# Patient Record
Sex: Male | Born: 1960 | Race: Black or African American | Hispanic: No | Marital: Married | Smoking: Current every day smoker
Health system: Southern US, Community
[De-identification: ages and names within clinical notes are randomized; demographics above are authoritative.]

## PROBLEM LIST (undated history)

## (undated) HISTORY — PX: MANDIBLE SURGERY: SHX707

---

## 2000-10-12 ENCOUNTER — Emergency Department (HOSPITAL_COMMUNITY): Admission: EM | Admit: 2000-10-12 | Discharge: 2000-10-12 | Payer: Self-pay | Admitting: Internal Medicine

## 2002-07-25 ENCOUNTER — Emergency Department (HOSPITAL_COMMUNITY): Admission: EM | Admit: 2002-07-25 | Discharge: 2002-07-25 | Payer: Self-pay | Admitting: Emergency Medicine

## 2003-05-25 ENCOUNTER — Emergency Department (HOSPITAL_COMMUNITY): Admission: EM | Admit: 2003-05-25 | Discharge: 2003-05-26 | Payer: Self-pay | Admitting: Emergency Medicine

## 2003-07-09 ENCOUNTER — Emergency Department (HOSPITAL_COMMUNITY): Admission: EM | Admit: 2003-07-09 | Discharge: 2003-07-09 | Payer: Self-pay | Admitting: Emergency Medicine

## 2004-05-27 ENCOUNTER — Ambulatory Visit: Payer: Self-pay | Admitting: Internal Medicine

## 2004-10-30 ENCOUNTER — Ambulatory Visit: Payer: Self-pay | Admitting: Internal Medicine

## 2004-10-31 ENCOUNTER — Ambulatory Visit (HOSPITAL_COMMUNITY): Admission: RE | Admit: 2004-10-31 | Discharge: 2004-10-31 | Payer: Self-pay | Admitting: Internal Medicine

## 2004-11-08 ENCOUNTER — Ambulatory Visit: Payer: Self-pay | Admitting: Internal Medicine

## 2004-11-12 ENCOUNTER — Ambulatory Visit: Payer: Self-pay | Admitting: *Deleted

## 2005-01-05 ENCOUNTER — Emergency Department (HOSPITAL_COMMUNITY): Admission: EM | Admit: 2005-01-05 | Discharge: 2005-01-05 | Payer: Self-pay | Admitting: Emergency Medicine

## 2005-11-12 ENCOUNTER — Emergency Department (HOSPITAL_COMMUNITY): Admission: EM | Admit: 2005-11-12 | Discharge: 2005-11-12 | Payer: Self-pay | Admitting: Emergency Medicine

## 2007-05-21 ENCOUNTER — Emergency Department (HOSPITAL_COMMUNITY): Admission: EM | Admit: 2007-05-21 | Discharge: 2007-05-21 | Payer: Self-pay | Admitting: Emergency Medicine

## 2009-01-27 ENCOUNTER — Emergency Department (HOSPITAL_COMMUNITY): Admission: EM | Admit: 2009-01-27 | Discharge: 2009-01-27 | Payer: Self-pay | Admitting: Emergency Medicine

## 2010-02-18 ENCOUNTER — Emergency Department (HOSPITAL_COMMUNITY)
Admission: EM | Admit: 2010-02-18 | Discharge: 2010-02-18 | Payer: Self-pay | Source: Home / Self Care | Admitting: *Deleted

## 2010-06-05 LAB — COMPREHENSIVE METABOLIC PANEL
AST: 33 U/L (ref 0–37)
Albumin: 5 g/dL (ref 3.5–5.2)
Alkaline Phosphatase: 81 U/L (ref 39–117)
BUN: 14 mg/dL (ref 6–23)
CO2: 24 mEq/L (ref 19–32)
GFR calc Af Amer: >60 mL/min (ref >60)
GFR calc non Af Amer: >60 mL/min (ref >60)
Glucose, Bld: 109 mg/dL — ABNORMAL HIGH (ref 70–99)
Potassium: 4.1 mEq/L (ref 3.5–5.1)
Total Protein: 8.9 g/dL — ABNORMAL HIGH (ref 6.0–8.3)

## 2010-06-05 LAB — DIFFERENTIAL
Basophils Absolute: 0 10*3/uL (ref 0.0–0.1)
Basophils Relative: 0 % (ref 0–1)
Eosinophils Absolute: 0.1 K/uL (ref 0.0–0.7)
Eosinophils Relative: 1 % (ref 0–5)
Lymphocytes Relative: 7 % — ABNORMAL LOW (ref 12–46)
Lymphs Abs: 0.9 10*3/uL (ref 0.7–4.0)
Monocytes Absolute: 0.7 10*3/uL (ref 0.1–1.0)
Monocytes Relative: 6 % (ref 3–12)
Neutro Abs: 10.4 K/uL — ABNORMAL HIGH (ref 1.7–7.7)
Neutrophils Relative %: 86 % — ABNORMAL HIGH (ref 43–77)

## 2010-06-05 LAB — CBC
HCT: 54.5 % — ABNORMAL HIGH (ref 39.0–52.0)
Hemoglobin: 18.1 g/dL — ABNORMAL HIGH (ref 13.0–17.0)
MCHC: 33.1 g/dL (ref 30.0–36.0)
MCV: 93 fL (ref 78.0–100.0)
Platelets: 313 10*3/uL (ref 150–400)
RBC: 5.86 MIL/uL — ABNORMAL HIGH (ref 4.22–5.81)
RDW: 13.6 % (ref 11.5–15.5)
WBC: 12.1 10*3/uL — ABNORMAL HIGH (ref 4.0–10.5)

## 2010-06-05 LAB — CLOSTRIDIUM DIFFICILE EIA: C difficile Toxins A+B, EIA: NEGATIVE

## 2010-06-05 LAB — COMPREHENSIVE METABOLIC PANEL WITH GFR
ALT: 34 U/L (ref 0–53)
Calcium: 10.1 mg/dL (ref 8.4–10.5)
Chloride: 103 meq/L (ref 96–112)
Creatinine, Ser: 1.28 mg/dL (ref 0.4–1.5)
Sodium: 137 meq/L (ref 135–145)
Total Bilirubin: 0.5 mg/dL (ref 0.3–1.2)

## 2010-06-05 LAB — STOOL CULTURE

## 2010-06-05 LAB — HEMOCCULT GUIAC POC 1CARD (OFFICE): Fecal Occult Bld: POSITIVE

## 2011-03-06 IMAGING — CT CT ABDOMEN W/ CM
2 of 5 series · 17 of 46 positions shown, 19 images · IV contrast (APPLIED)
Comparison: None.

CT ABDOMEN

CLINICAL DATA: Abdominal pain nausea, vomiting, elevated white
count

CT ABDOMEN AND PELVIS WITH CONTRAST
TECHNIQUE: Multidetector CT imaging of the abdomen and pelvis was
performed using the standard protocol following bolus
administration of intravenous contrast.
Contrast: 120 ml Ymnipaque-OCC

[Series 2: abd/pelv with 5.0 b31f st · axial · 0.76mm/px · z∈[-510,-86]mm · 14 of 96 slices shown, 16 images]
[im 6/96  soft-tissue]
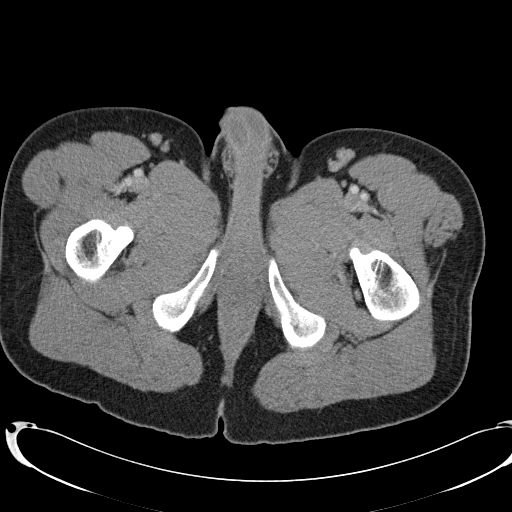
[im 6/96  bone]
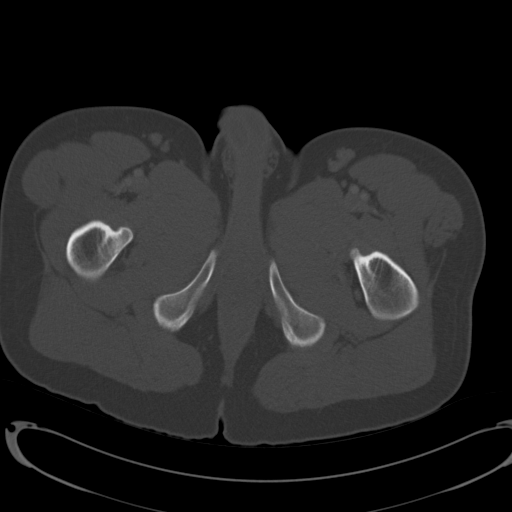
[im 11/96  soft-tissue]
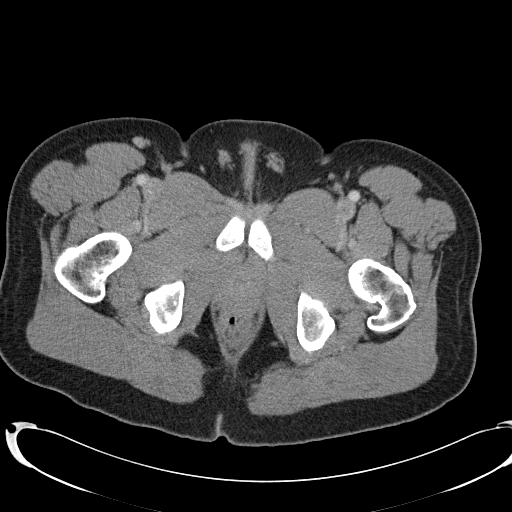
[im 21/96  soft-tissue]
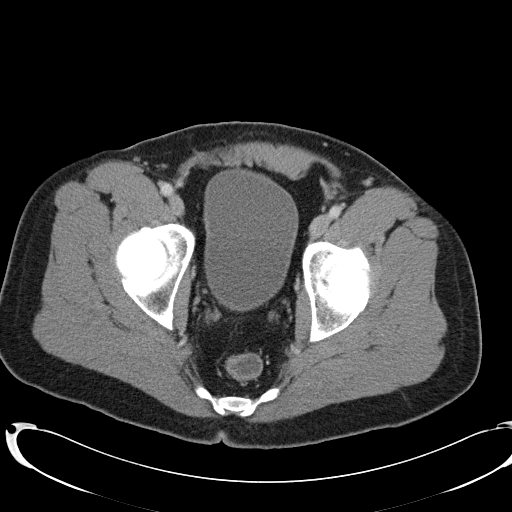
[im 26/96  soft-tissue]
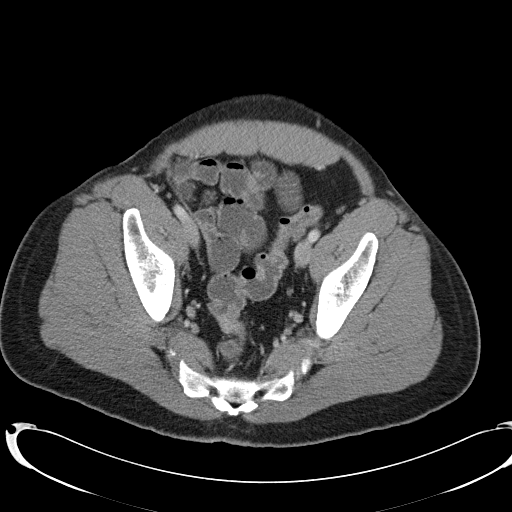
[im 31/96  soft-tissue]
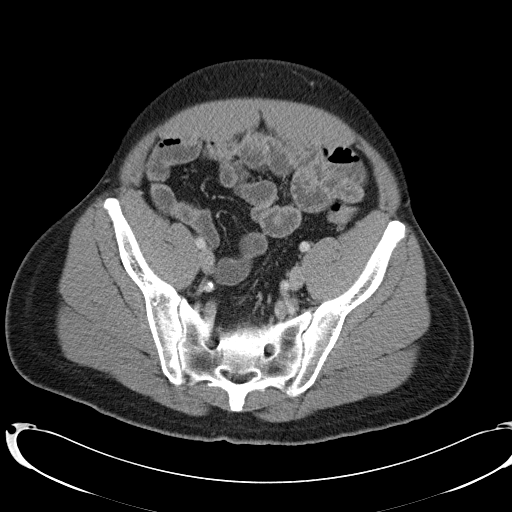
[im 41/96  soft-tissue]
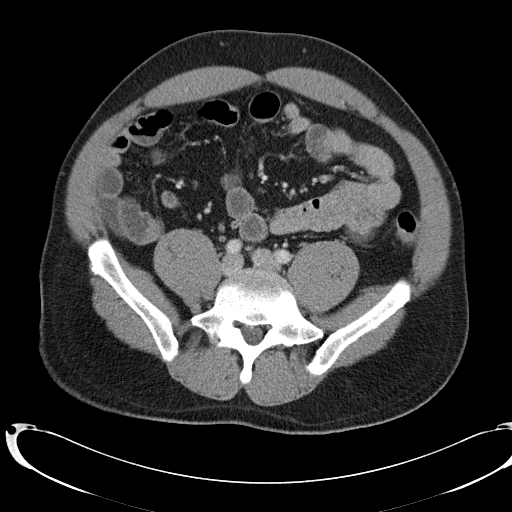
[im 46/96  soft-tissue]
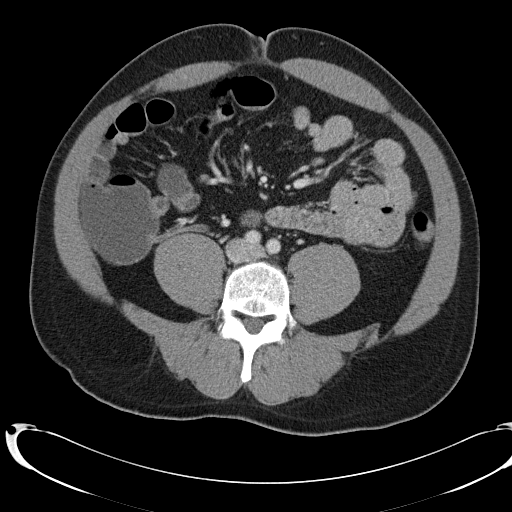
[im 51/96  soft-tissue]
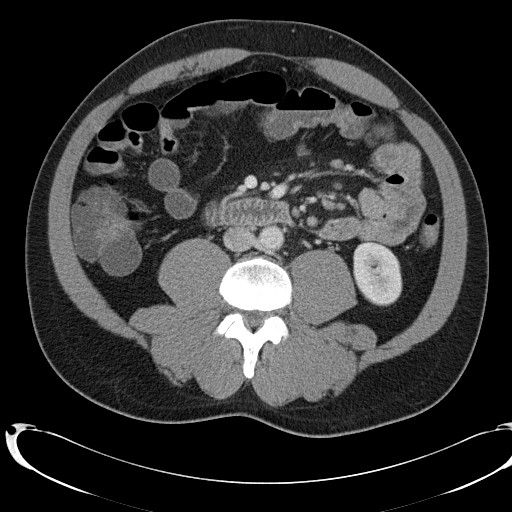
[im 56/96  soft-tissue]
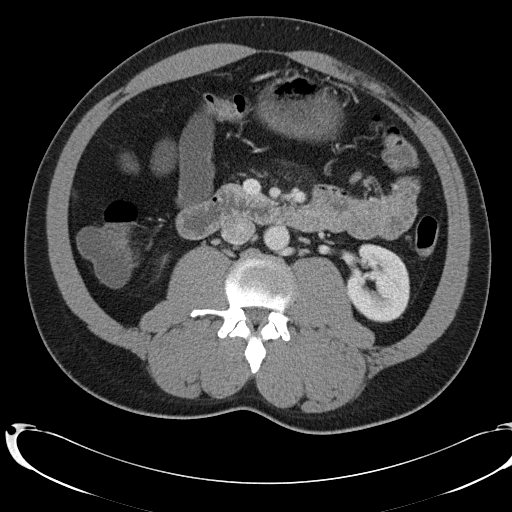
[im 56/96  bone]
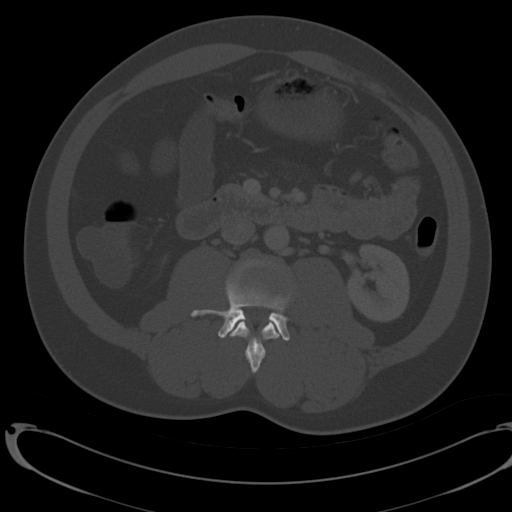
[im 66/96  soft-tissue]
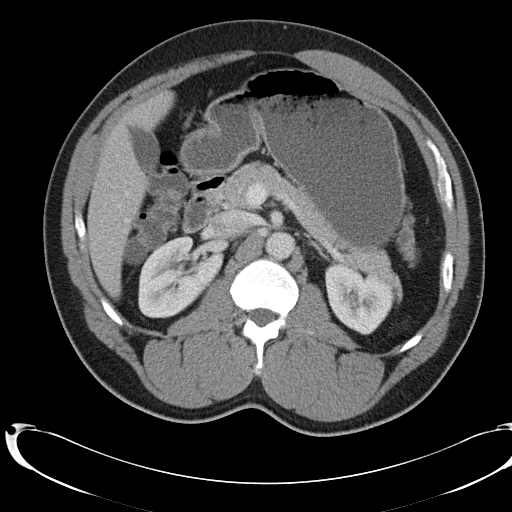
[im 71/96  soft-tissue]
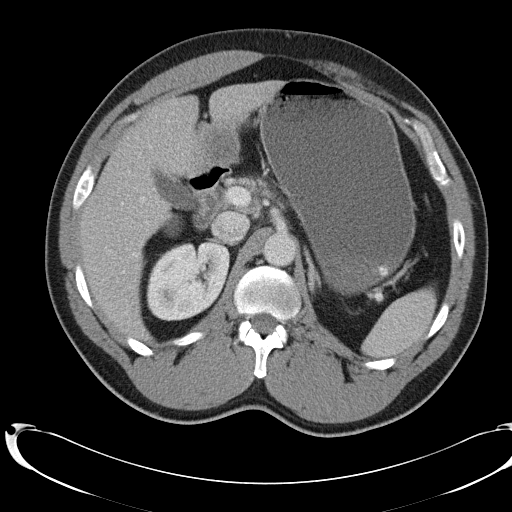
[im 76/96  soft-tissue]
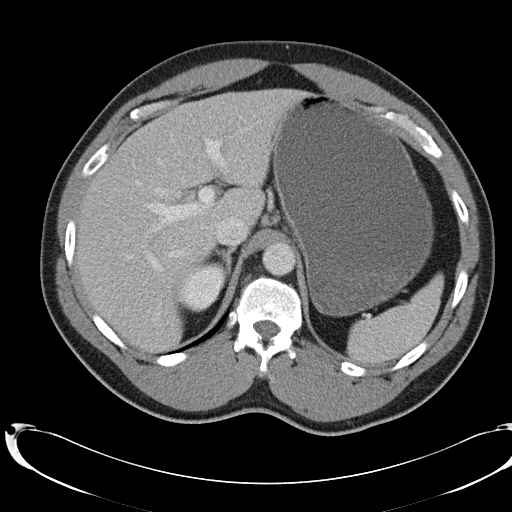
[im 86/96  soft-tissue]
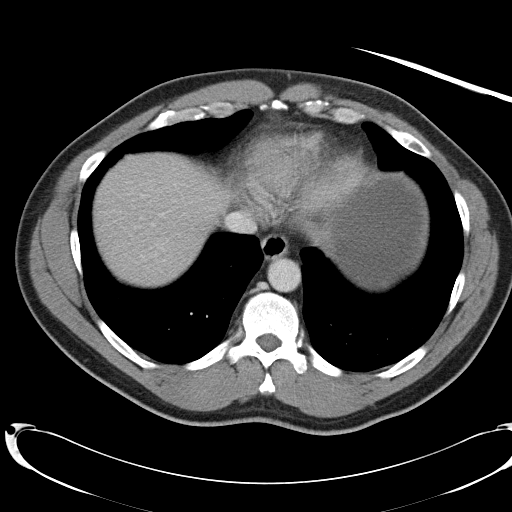
[im 91/96  soft-tissue]
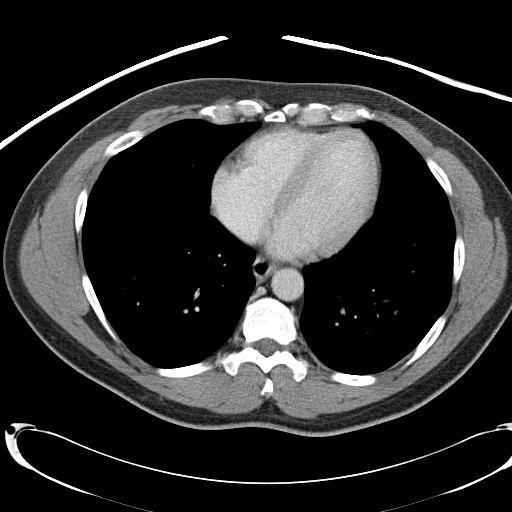

[Series 602: cor · coronal · 0.94mm/px · 3 of 97 slices shown]
[im 33/97  soft-tissue]
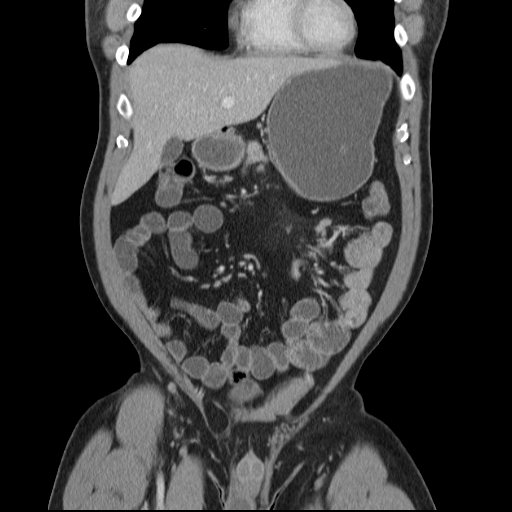
[im 43/97  soft-tissue]
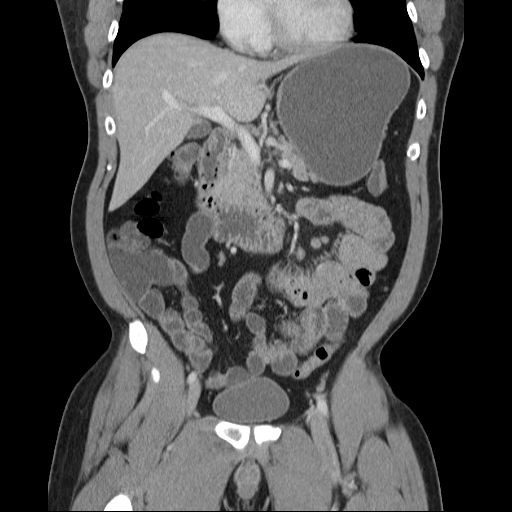
[im 54/97  soft-tissue]
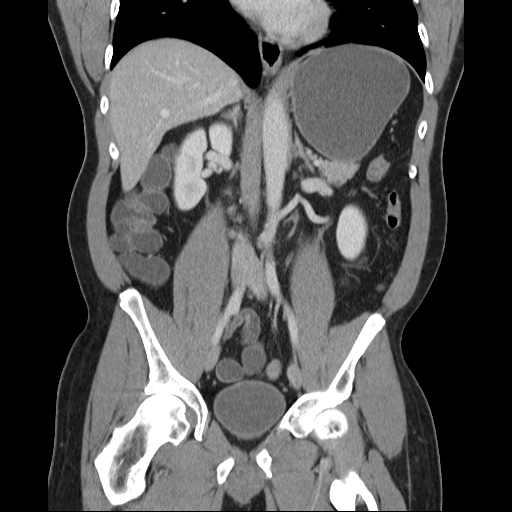

[17 of 46 positions shown; findings below may reference images not displayed]

FINDINGS: Lung bases clear.  No pericardial or pleural effusion.
No hiatal hernia.

The liver, gallbladder, biliary system, adrenal glands, spleen,
pancreas, and kidneys are within normal limits for age.  Slight
prominence of the central mesenteric lymph nodes without pathologic
enlargement.  Mild haziness to the mesenteric fat centrally.  This
is nonspecific and can be seen with mild mesenteric adenitis.

No bowel obstruction pattern, significant dilatation, ileus, or
free air.  Diffuse fluid levels throughout the small and large
bowel.  No abdominal free fluid, fluid collection, hemorrhage or
abscess.  The terminal ileum and appendix are normal in appearance.

Left renal vein is circumaortic in configuration, a normal variant.
IMPRESSION: No acute intra-abdominal finding
Slight prominence of the central mesenteric lymph nodes and hazy
surrounding mesenteric fat, suspect mild mesenteric adenitis.

Nonspecific diffuse bowel air fluid levels without obstruction or
ileus.

CT PELVIS
FINDINGS: No pelvic free fluid, fluid collection, abscess,
hemorrhage or adenopathy.  No acute distal bowel process.  Normal
urinary bladder.  Small right inguinal hernia containing only fat.

No acute osseous finding.
IMPRESSION: No acute intrapelvic finding

## 2011-03-24 ENCOUNTER — Encounter (HOSPITAL_COMMUNITY): Payer: Self-pay | Admitting: Emergency Medicine

## 2011-03-24 ENCOUNTER — Emergency Department (HOSPITAL_COMMUNITY)
Admission: EM | Admit: 2011-03-24 | Discharge: 2011-03-24 | Disposition: A | Discharge status: Home / Self Care | Payer: Self-pay | Attending: Emergency Medicine | Admitting: Emergency Medicine

## 2011-03-24 DIAGNOSIS — M545 Low back pain, unspecified: Secondary | ICD-10-CM | POA: Insufficient documentation

## 2011-03-24 DIAGNOSIS — M79609 Pain in unspecified limb: Secondary | ICD-10-CM | POA: Insufficient documentation

## 2011-03-24 DIAGNOSIS — M25559 Pain in unspecified hip: Secondary | ICD-10-CM | POA: Insufficient documentation

## 2011-03-24 DIAGNOSIS — M5430 Sciatica, unspecified side: Secondary | ICD-10-CM | POA: Insufficient documentation

## 2011-03-24 DIAGNOSIS — M549 Dorsalgia, unspecified: Secondary | ICD-10-CM

## 2011-03-24 MED ORDER — OXYCODONE-ACETAMINOPHEN 5-325 MG PO TABS: 2.0000 | ORAL_TABLET | ORAL | Status: AC | PRN

## 2011-03-24 MED ORDER — KETOROLAC TROMETHAMINE 60 MG/2ML IM SOLN
60.0000 mg | Freq: Once | INTRAMUSCULAR | Status: AC
  Administered 2011-03-24: 60 mg via INTRAMUSCULAR
  Filled 2011-03-24: qty 2

## 2011-03-24 MED ORDER — CYCLOBENZAPRINE HCL 10 MG PO TABS: 10.0000 mg | ORAL_TABLET | Freq: Three times a day (TID) | ORAL | Status: AC | PRN

## 2011-03-24 MED ORDER — CYCLOBENZAPRINE HCL 10 MG PO TABS
10.0000 mg | ORAL_TABLET | Freq: Once | ORAL | Status: AC
  Administered 2011-03-24: 10 mg via ORAL
  Filled 2011-03-24: qty 1

## 2011-03-24 MED ORDER — IBUPROFEN 800 MG PO TABS: 800.0000 mg | ORAL_TABLET | Freq: Three times a day (TID) | ORAL | Status: AC

## 2011-03-24 MED ORDER — OXYCODONE-ACETAMINOPHEN 5-325 MG PO TABS
1.0000 | ORAL_TABLET | Freq: Once | ORAL | Status: AC
  Administered 2011-03-24: 1 via ORAL
  Filled 2011-03-24: qty 1

## 2011-03-24 NOTE — ED Notes (Signed)
Pt st's he was trying to move a log during the snow, developed lower back pain on Fri. F

## 2011-03-24 NOTE — ED Provider Notes (Signed)
History     CSN: [id number]  Arrival date & time 03/24/11  1704   None     Chief Complaint  Patient presents with  . Back Pain    (Consider location/radiation/quality/duration/timing/severity/associated sxs/prior treatment) Patient is a 51 y.o. male presenting with back pain. The history is provided by the patient. No language interpreter was used.  Back Pain  This is a new problem. The current episode started more than 2 days ago. The problem occurs constantly. The problem has been gradually worsening. Associated with: moving a log. Pain location: bilateral lower back  The pain radiates to the left thigh and right thigh. The pain is at a severity of 9/10. The pain is moderate. Associated symptoms include leg pain. Pertinent negatives include no chest pain, no fever, no numbness, no headaches, no bowel incontinence, no perianal numbness, no bladder incontinence, no dysuria, no pelvic pain, no paresthesias, no paresis, no tingling and no weakness. Treatments tried: codiene. The treatment provided no relief.   present to the ER with bilateral lower back pain with radiation to bilateral posterior thighs. States that minutes a log 3 days ago and since then it has progressively gotten worse. He has tried Tylenol with codeine with no relief. 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 Denies bowel or bladder problems. Denies numbness or tingling or cell paresthesia.  History reviewed. No pertinent past medical history.  Past Surgical History  Procedure Date  . Mandible surgery     No family history on file.  History  Substance Use Topics  . Smoking status: Never Smoker   . Smokeless tobacco: Not on file  . Alcohol Use: No      Review of Systems  Constitutional: Negative for fever.  Cardiovascular: Negative for chest pain.  Gastrointestinal: Negative for bowel incontinence.  Genitourinary: Negative for bladder incontinence,  dysuria and pelvic pain.  Musculoskeletal: Positive for back pain.  Neurological: Negative for tingling, weakness, numbness, headaches and paresthesias.  All other systems reviewed and are negative.    Allergies  Review of patient's allergies indicates no known allergies.  Home Medications   Current Outpatient Rx  Name Route Sig Dispense Refill  . ACETAMINOPHEN-CODEINE #3 300-30 MG PO TABS Oral Take 1 tablet by mouth once as needed. For pain      BP 115/68  Pulse 70  Temp(Src) 98.2 F (36.8 C) (Oral)  Resp 14  SpO2 99%  Physical Exam  Nursing note and vitals reviewed. Constitutional: He is oriented to person, place, and time. He appears well-developed and well-nourished.  HENT:  Head: Normocephalic and atraumatic.  Eyes: Pupils are equal, round, and reactive to light.  Neck: Neck supple.  Cardiovascular: Normal rate and regular rhythm.  Exam reveals no gallop and no friction rub.   No murmur heard. Pulmonary/Chest: Breath sounds normal. No respiratory distress.  Abdominal: Soft. He exhibits no distension.  Musculoskeletal: He exhibits tenderness. He exhibits no edema.       Bilateral  lower back tenderness  Neurological: He is alert and oriented to person, place, and time. No cranial nerve deficit.  Skin: Skin is warm and dry.  Psychiatric: He has a normal mood and affect.    ED Course  Procedures (including critical care time)  Labs Reviewed - No data to display No results found.   No diagnosis found.    MDM  Seen in the ER for bilateral lower back pain sciatica to  bilateral  posterior thighs. Denies bowel or bladder problems, saddle paresthesia or weakness. States that he was moving a log 3 days ago and the pain has progressively gotten worse since. He will followup with the PCP of his choice or return to the ER for worsening symptoms including weakness, bowel or bladder problems or numbness.        Jethro Bastos, NP 03/24/11 1939

## 2011-03-24 NOTE — ED Notes (Signed)
The pt has had lower back pain with radiation down both his legs for 3 days.  He has had a back injury in the past.

## 2011-03-24 NOTE — ED Provider Notes (Signed)
Medical screening examination/treatment/procedure(s) were performed by non-physician practitioner and as supervising physician I was immediately available for consultation/collaboration.   Benny Lennert, MD 03/24/11 [phone]

## 2011-08-21 ENCOUNTER — Encounter (HOSPITAL_COMMUNITY): Payer: Self-pay | Admitting: *Deleted

## 2011-08-21 ENCOUNTER — Emergency Department (HOSPITAL_COMMUNITY)
Admission: EM | Admit: 2011-08-21 | Discharge: 2011-08-21 | Disposition: A | Discharge status: Home / Self Care | Payer: Self-pay | Attending: Emergency Medicine | Admitting: Emergency Medicine

## 2011-08-21 DIAGNOSIS — M545 Low back pain, unspecified: Secondary | ICD-10-CM | POA: Insufficient documentation

## 2011-08-21 MED ORDER — HYDROCODONE-ACETAMINOPHEN 5-325 MG PO TABS
2.0000 | ORAL_TABLET | Freq: Once | ORAL | Status: AC
  Administered 2011-08-21: 2 via ORAL
  Filled 2011-08-21: qty 2

## 2011-08-21 MED ORDER — IBUPROFEN 800 MG PO TABS: 800.0000 mg | ORAL_TABLET | Freq: Three times a day (TID) | ORAL | Status: AC

## 2011-08-21 MED ORDER — HYDROCODONE-ACETAMINOPHEN 5-325 MG PO TABS: 2.0000 | ORAL_TABLET | ORAL | Status: AC | PRN

## 2011-08-21 NOTE — ED Notes (Signed)
Patient complains of lower back pain for 4 days.  He states he tried to wait but couldn't wait any longer.  He has been seen for same in past

## 2011-08-21 NOTE — ED Notes (Signed)
Pt states that his lower back has been hurting in the past 4 days, pain level is at a 10, pt states he has been taken a muscle relaxer  (Flexril) w/o relieve for 4 days.

## 2011-08-21 NOTE — ED Provider Notes (Signed)
History     CSN: [id number]  Arrival date & time 08/21/11  1635   First MD Initiated Contact with Patient 08/21/11 1847      Chief Complaint  Patient presents with  . Back Pain    (Consider location/radiation/quality/duration/timing/severity/associated sxs/prior treatment) HPI Complains of low nonradiating back pain onset 4 days ago. Pain worse with movement improved with remaining still. Treated with oxycodone with partial relief. Also tried treating himself with Flexeril from an old prescription with no relief. No loss of bladder or bowel control no injury. Pain is moderate at present. Sharp in quality History reviewed. No pertinent past medical history. Past Surgical History  Procedure Date  . Mandible surgery    Past medical history back pain No family history on file.  History  Substance Use Topics  . Smoking status: Never Smoker   . Smokeless tobacco: Not on file  . Alcohol Use: No      Review of Systems  Constitutional: Negative.   HENT: Negative.   Respiratory: Negative.   Cardiovascular: Negative.   Gastrointestinal: Negative.   Musculoskeletal: Positive for back pain.  Skin: Negative.   Neurological: Negative.   Hematological: Negative.   Psychiatric/Behavioral: Negative.     Allergies  Review of patient's allergies indicates no known allergies.  Home Medications   Current Outpatient Rx  Name Route Sig Dispense Refill  . ACETAMINOPHEN-CODEINE #3 300-30 MG PO TABS Oral Take 1 tablet by mouth once as needed. For pain      BP 146/81  Pulse 78  Temp 98.1 F (36.7 C) (Oral)  Resp 16  SpO2 100%  Physical Exam  Nursing note and vitals reviewed. Constitutional: He appears well-developed and well-nourished.  HENT:  Head: Normocephalic and atraumatic.  Eyes: Conjunctivae are normal. Pupils are equal, round, and reactive to light.  Neck: Neck supple. No tracheal deviation present. No thyromegaly present.  Cardiovascular: Normal rate and regular  rhythm.   No murmur heard. Pulmonary/Chest: Effort normal and breath sounds normal.  Abdominal: Soft. Bowel sounds are normal. He exhibits no distension. There is no tenderness.  Musculoskeletal: Normal range of motion. He exhibits no edema and no tenderness.       Entire spine nontender pain at lumbar air when he stands up from a seated position  Neurological: He is alert. He has normal reflexes. Coordination normal.       Gait normal  Skin: Skin is warm and dry. No rash noted.  Psychiatric: He has a normal mood and affect.    ED Course  Procedures (including critical care time)  Labs Reviewed - No data to display No results found.   No diagnosis found.    MDM   plan prescriptions Norco, ibuprofen. Referral resource guide Pain felt to be musculoskeletal in etiology Diagnosis low back pain        Doug Sou, MD 08/21/11 1902

## 2011-08-21 NOTE — Discharge Instructions (Signed)
Back Pain, Adult Call the physician resource guide to get a primary care doctor tomorrow and to be seen if pain not improved by next week. Return if her condition worsens for any reason Back pain is very common. The pain often gets better over time. The cause of back pain is usually not dangerous. Most people can learn to manage their back pain on their own.  HOME CARE   Stay active. Start with short walks on flat ground if you can. Try to walk farther each day.   Do not sit, drive, or stand in one place for more than 30 minutes. Do not stay in bed.   Do not avoid exercise or work. Activity can help your back heal faster.   Be careful when you bend or lift an object. Bend at your knees, keep the object close to you, and do not twist.   Sleep on a firm mattress. Lie on your side, and bend your knees. If you lie on your back, put a pillow under your knees.   Only take medicines as told by your doctor.   Put ice on the injured area.   Put ice in a plastic bag.   Place a towel between your skin and the bag.   Leave the ice on for 15 to 20 minutes, 3 to 4 times a day for the first 2 to 3 days. After that, you can switch between ice and heat packs.   Ask your doctor about back exercises or massage.   Avoid feeling anxious or stressed. Find good ways to deal with stress, such as exercise.  GET HELP RIGHT AWAY IF:   Your pain does not go away with rest or medicine.   Your pain does not go away in 1 week.   You have new problems.   You do not feel well.   The pain spreads into your legs.   You cannot control when you poop (bowel movement) or pee (urinate).   Your arms or legs feel weak or lose feeling (numbness).   You feel sick to your stomach (nauseous) or throw up (vomit).   You have belly (abdominal) pain.   You feel like you may pass out (faint).  MAKE SURE YOU:   Understand these instructions.   Will watch your condition.   Will get help right away if you are not  doing well or get worse.  Document Released: 08/06/2007 Document Revised: 02/06/2011 Document Reviewed: 07/08/2010 [facility] Patient Information 2012 [place], [place].   RESOURCE GUIDE  Chronic Pain Problems: Contact [facility]  [phone] Patients need to be referred by their primary care doctor.  Insufficient Money for Medicine: Contact United Way:  call "211" or [facility] [phone].  No Primary Care Doctor: - Call [facility]  [phone] - can help you locate a primary care doctor that  accepts your insurance, provides certain services, etc. - Physician Referral [phone]  Agencies that provide inexpensive medical care: - [facility]  [phone] - [facility]  [phone] - [facility]  [phone] [facility]  [phone] - [facility]  [phone] [facility]  [phone]  Medicaid-accepting [facility] Providers: - [facility]- [address]  [phone], Mon-Fri 9am-7pm, Sat 9am-1pm - [facility]- [address] [place], [address] [place]  [phone] - [facility]- [address], [address] [place]  [phone] - [facility]- [address]  [phone] - Renaye Rakers- [address] [place], [address], [phone]  Only accepts [place] patients after they have their name  applied to their card  Self Pay (no insurance) in [facility]: - Sickle Cell Patients: Dr Willey Blade, [facility]  [address] [place], [phone] - [facility] Care- [address] [place]  [phone]       [facility] [place]- [address]       -     [facility]- see information above (Speak to [employer] if you do not have insurance)       -  [facility]- [address] [place], [phone]       -  [facility]- [address],  [phone]        -  [facility]- [address] [phone]       -  Dr Julio Sicks-  [address], [place], [phone]       -  [facility]- [address], [phone]       -  [facility]- [address], [phone], also [address] [phone]       -    [facility]- [address] [place], [phone], 1st & 3rd Saturday   every month, 10am-1pm  1) Find a Doctor and Pay Out of Pocket Although you won't have to find out who is covered by your insurance plan, it is a good idea to ask around and get recommendations. You will then need to call the office and see if the doctor you have chosen will accept you as a new patient and what types of options they offer for patients who are self-pay. Some doctors offer discounts or will set up payment plans for their patients who do not have insurance, but you will need to ask so you aren't surprised when you get to your appointment.  2) Contact Your Local Health Department Not all health departments have doctors that can see patients for sick visits, but many do, so it is worth a call to see if yours does. If you don't know where your local health department is, you can check in your phone book. The CDC also has a tool to help you locate your state's health department, and many state websites also have listings of all of their local health departments.  3) Find a Walk-in Clinic If your illness is not likely to be very severe or complicated, you may want to try a walk in clinic. These are popping up all over the country in pharmacies, drugstores, and shopping centers. They're usually staffed by nurse practitioners or physician assistants that have been trained to treat common illnesses and complaints. They're usually fairly quick and inexpensive. However, if you have serious medical issues or chronic medical problems, these are probably not your best option  STD Testing - [facility] Department of  [facility], STD Clinic, [address], [place], phone [phone] or [phone].  Monday - Friday, call for an appointment. [facility] Department of [employer], STD Clinic, [address], [place], phone [phone] or [phone].  Monday - Friday, call for an appointment.  Abuse/Neglect: [facility] Child Abuse Hotline 986-556-6637 [facility] Child Abuse Hotline 845-506-5288 (After  Hours)  Emergency Shelter:  [facility] (380)057-5765  Maternity Homes: - [facility] 5130572133 - [facility] ([phone]  MRSA Hotline #:   4581979996  [facility] Resources  [facility]  United Way [facility] Dept. [address]                 [address]         [address] [place] [address]  Blondell Reveal Phone:  [phone]                                  Phone:  [phone]                   Phone:  [phone]  [facility], [phone] - [phone]       -     [facility] in [place], [address]                                  [phone], [facility] Child Abuse Hotline 518 306 9866 or 318-579-4817 (After Hours)   Behavioral Health Services  Substance Abuse Resources: - [facility]  213-704-6398 - [facility] [phone] - [facility] [place] [phone] Floydene Flock [phone] - Residential & Outpatient Substance Abuse Program  [phone]  Psychological Services: [facility]  [phone] Services  [phone] - [facility], [phone] [place]. [address] [place], ACCESS LINE: 5173820534 or 9415423352,  [employer]an.[employer].za  Dental Assistance  If unable to pay or uninsured, contact:  [facility] or [facility]. to become qualified for the adult dental clinic.  Patients with Medicaid: [facility] [phone], [phone] [address], [phone]  If unable to pay, or uninsured, contact [facility] [phone]) or [facility] Department [phone] in [place], [phone] in [facility]) to become qualified for the adult dental clinic  Other Low-Cost Community Dental Services: - [facility]- [address] [place], [place], [place], [address], [phone], [phone], 2nd and 4th Thursday of the month at 6:30am.  10 clients each day by appointment, can sometimes see walk-in patients if someone does not show for an appointment. [facility]- [address] Ether Griffins [place], [place], [address], [phone] - [facility]- [address], [place], [place], [address], [phone] - [facility] [facility]- [phone] [facility] Health Department- [phone] [facility] Department- [phone]

## 2012-04-10 ENCOUNTER — Emergency Department (HOSPITAL_COMMUNITY)
Admission: EM | Admit: 2012-04-10 | Discharge: 2012-04-11 | Disposition: A | Discharge status: Home / Self Care | Payer: Self-pay | Attending: Emergency Medicine | Admitting: Emergency Medicine

## 2012-04-10 ENCOUNTER — Encounter (HOSPITAL_COMMUNITY): Payer: Self-pay | Admitting: Adult Health

## 2012-04-10 DIAGNOSIS — H109 Unspecified conjunctivitis: Secondary | ICD-10-CM | POA: Insufficient documentation

## 2012-04-10 DIAGNOSIS — H5789 Other specified disorders of eye and adnexa: Secondary | ICD-10-CM | POA: Insufficient documentation

## 2012-04-10 DIAGNOSIS — H538 Other visual disturbances: Secondary | ICD-10-CM | POA: Insufficient documentation

## 2012-04-10 DIAGNOSIS — H5710 Ocular pain, unspecified eye: Secondary | ICD-10-CM | POA: Insufficient documentation

## 2012-04-10 MED ORDER — GENTAMICIN SULFATE 0.3 % OP SOLN: 1.0000 [drp] | OPHTHALMIC | Status: DC

## 2012-04-10 NOTE — ED Notes (Signed)
Pt c/o Left eye drainage and redness X 1 week. Pt reports drainage is usually in mornings when he wakes up, eye is crusted shut. No redness seen to either eye. Pt in nad.

## 2012-04-10 NOTE — ED Notes (Signed)
Presents with left eye redness that began last week associated with drainage, denies itchiness. Pt reports stinging.

## 2012-04-10 NOTE — ED Provider Notes (Signed)
History  This chart was scribed for non-physician practitioner, Wynetta Emery, PA-C working with Loren Racer, MD by Shari Heritage, ED Scribe. This patient was seen in room TR04C/TR04C and the patient's care was started at 2225.   CSN: [id number]  Arrival date & time 04/10/12  2225     Chief Complaint  Patient presents with  . Conjunctivitis    The history is provided by the patient. No language interpreter was used.    HPI Comments: David Bautista is a 52 y.o. male who presents to the Emergency Department complaining of eye pain, redness and drainage on the left onset 1 week ago. He states that pain is stinging in quality, is moderate in severity and does not radiate. Patient states that drainage is often crusty in nature.  There is associated blurriness in left eye, but no significant changes in visual acuity. He denies fever, nausea or vomiting. He denies any other complaints at this time. Patient reports no other significant past medical history. He does not smoke.  History reviewed. No pertinent past medical history.  Past Surgical History  Procedure Laterality Date  . Mandible surgery      History reviewed. No pertinent family history.  History  Substance Use Topics  . Smoking status: Never Smoker   . Smokeless tobacco: Not on file  . Alcohol Use: No      Review of Systems  Eyes: Positive for pain, discharge and redness.  Respiratory: Negative for shortness of breath.   Cardiovascular: Negative for chest pain.  All other systems reviewed and are negative.    Allergies  Review of patient's allergies indicates no known allergies.  Home Medications   Current Outpatient Rx  Name  Route  Sig  Dispense  Refill  . acetaminophen-codeine (TYLENOL #3) 300-30 MG per tablet   Oral   Take 1 tablet by mouth once as needed. For pain           Triage Vitals: BP 123/69  Pulse 64  Temp(Src) 98.2 F (36.8 C) (Oral)  Resp 18  SpO2 97%  Physical Exam  Nursing  note and vitals reviewed. Constitutional: He is oriented to person, place, and time. He appears well-developed and well-nourished. No distress.  HENT:  Head: Normocephalic and atraumatic.  Eyes: EOM are normal. Pupils are equal, round, and reactive to light.  Mildly bilaterally injected bulbar conjunctivae with no purulent discharge  Cardiovascular: Normal rate.   Pulmonary/Chest: Effort normal. No stridor.  Musculoskeletal: Normal range of motion.  Neurological: He is alert and oriented to person, place, and time.  Psychiatric: He has a normal mood and affect.    ED Course  Procedures (including critical care time) DIAGNOSTIC STUDIES: Oxygen Saturation is 97% on room air, normal by my interpretation.    COORDINATION OF CARE: 10:47 PM- Instructed to take prescribed medicine as instructed. Advised to return to the ED after 48 hours if symptoms do not improve or worsen. Patient informed of current plan for treatment and agrees with plan at this time.     Labs Reviewed - No data to display No results found.   1. Conjunctivitis       MDM     Filed Vitals:   04/10/12 2235 04/10/12 2329  BP: 123/69 112/74  Pulse: 64 70  Temp: 98.2 F (36.8 C) 97.9 F (36.6 C)  TempSrc: Oral Oral  Resp: 18 18  SpO2: 97% 95%     Pt verbalized understanding and agrees with care plan. Outpatient follow-up and  return precautions given.    Discharge Medication List as of 04/10/2012 10:54 PM    START taking these medications   Details  gentamicin (GARAMYCIN) 0.3 % ophthalmic solution Place 1 drop into both eyes every 4 (four) hours., Starting 04/10/2012, Until Discontinued, Print        I personally performed the services described in this documentation, which was scribed in my presence. The recorded information has been reviewed and is accurate.   Wynetta Emery, PA-C 04/11/12 1713

## 2012-04-12 NOTE — ED Provider Notes (Signed)
Medical screening examination/treatment/procedure(s) were performed by non-physician practitioner and as supervising physician I was immediately available for consultation/collaboration.   Darionna Banke, MD 04/12/12 1538 

## 2012-11-06 ENCOUNTER — Encounter (HOSPITAL_COMMUNITY): Payer: Self-pay | Admitting: Family Medicine

## 2012-11-06 ENCOUNTER — Emergency Department (HOSPITAL_COMMUNITY)
Admission: EM | Admit: 2012-11-06 | Discharge: 2012-11-06 | Disposition: A | Discharge status: Home / Self Care | Payer: Self-pay | Attending: Emergency Medicine | Admitting: Emergency Medicine

## 2012-11-06 DIAGNOSIS — M654 Radial styloid tenosynovitis [de Quervain]: Secondary | ICD-10-CM | POA: Insufficient documentation

## 2012-11-06 DIAGNOSIS — M25539 Pain in unspecified wrist: Secondary | ICD-10-CM | POA: Insufficient documentation

## 2012-11-06 DIAGNOSIS — K0889 Other specified disorders of teeth and supporting structures: Secondary | ICD-10-CM

## 2012-11-06 DIAGNOSIS — M778 Other enthesopathies, not elsewhere classified: Secondary | ICD-10-CM

## 2012-11-06 DIAGNOSIS — M65839 Other synovitis and tenosynovitis, unspecified forearm: Secondary | ICD-10-CM | POA: Insufficient documentation

## 2012-11-06 DIAGNOSIS — F17200 Nicotine dependence, unspecified, uncomplicated: Secondary | ICD-10-CM | POA: Insufficient documentation

## 2012-11-06 DIAGNOSIS — G8929 Other chronic pain: Secondary | ICD-10-CM | POA: Insufficient documentation

## 2012-11-06 DIAGNOSIS — K089 Disorder of teeth and supporting structures, unspecified: Secondary | ICD-10-CM | POA: Insufficient documentation

## 2012-11-06 MED ORDER — NAPROXEN 375 MG PO TABS: 375.0000 mg | ORAL_TABLET | Freq: Two times a day (BID) | ORAL | Status: DC

## 2012-11-06 MED ORDER — PENICILLIN V POTASSIUM 500 MG PO TABS: 500.0000 mg | ORAL_TABLET | Freq: Four times a day (QID) | ORAL | Status: AC

## 2012-11-06 NOTE — ED Notes (Signed)
Pt presents with c/o right wrist pain with intermittent right index/middle finger numbness. RUE PMS intact at this time.  Pt also left bottom molar pain. Pt's molar appears chipped, mucous membrane swollen/reddened

## 2012-11-06 NOTE — ED Provider Notes (Signed)
CSN: [id number]     Arrival date & time 11/06/12  0118 History   None    Chief Complaint  Patient presents with  . Wrist Pain  . Dental Pain   (Consider location/radiation/quality/duration/timing/severity/associated sxs/prior Treatment) HPI David Bautista is a 52 y.o. male who presents to emergency department with 2 complaints: The dental pain and right wrist pain. Patient reports states pain is mainly over his right radial aspect of the wrist and worsened with movement of the wrist. Patient states sometimes pain radiates into his thumb. Patient denies any numbness or weakness in his hand. Patient denies any known injuries. Patient states that his dental pain is in his left lower back tooth and it began several years ago. Patient states that his pain is intermittent and comes and goes. States that it got worse several days ago. Patient denies any facial swelling, swelling under the tongue, fever, chills, malaise. Patient states he's taking ibuprofen and Tylenol for his symptoms but they're not helping. He should states that he has not followed up with his doctor or seen a dentist yet.  History reviewed. No pertinent past medical history. Past Surgical History  Procedure Laterality Date  . Mandible surgery     No family history on file. History  Substance Use Topics  . Smoking status: Current Every Day Smoker -- 0.25 packs/day    Types: Cigarettes  . Smokeless tobacco: Not on file  . Alcohol Use: Yes     Comment: occ.    Review of Systems  Constitutional: Negative for fever and chills.  HENT: Positive for dental problem. Negative for neck pain and neck stiffness.   Musculoskeletal: Positive for arthralgias. Negative for joint swelling.  Neurological: Negative for weakness and numbness.  All other systems reviewed and are negative.    Allergies  Review of patient's allergies indicates no known allergies.  Home Medications   Current Outpatient Rx  Name  Route  Sig  Dispense   Refill  . acetaminophen (TYLENOL) 500 MG tablet   Oral   Take 1,000 mg by mouth 2 (two) times daily as needed for pain.         Marland Kitchen ibuprofen (ADVIL,MOTRIN) 200 MG tablet   Oral   Take 400 mg by mouth 2 (two) times daily as needed for pain. For pain         . naproxen (NAPROSYN) 375 MG tablet   Oral   Take 1 tablet (375 mg total) by mouth 2 (two) times daily.   20 tablet   0   . penicillin v potassium (VEETID) 500 MG tablet   Oral   Take 1 tablet (500 mg total) by mouth 4 (four) times daily.   40 tablet   0    BP 128/76  Pulse 66  Temp(Src) 97 F (36.1 C)  SpO2 100% Physical Exam  Nursing note and vitals reviewed. Constitutional: He appears well-developed and well-nourished. No distress.  HENT:  Head: Normocephalic and atraumatic.  Cavity in left lower 2nd molar. It is partially chipped. No surrounding gum swelling. Tender to palpation. No swelling under the tongue. No facial swelling.   Eyes: Conjunctivae are normal.  Neck: Neck supple.  Cardiovascular: Normal rate, regular rhythm and normal heart sounds.   Pulmonary/Chest: Effort normal. No respiratory distress. He has no wheezes. He has no rales.  Musculoskeletal: He exhibits no edema.  Tenderness over the radial wrist joint. Full range of motion of the wrist. No bony tenderness. He should is able to make "  thumbs up", touch his thumb to every finger. Positive Finkerstein test.   Neurological: He is alert.  5 out of 5 and equal grip strength bilaterally  Skin: Skin is warm and dry.    ED Course  Procedures (including critical care time) Labs Review Labs Reviewed - No data to display Imaging Review No results found.  MDM   1. Pain, dental   2. Tendonitis of wrist, right   3. De Quervain's tenosynovitis, right    Patient with chronic right wrist pain and chronic dental pain. No signs of an oral abscess on the exam. Will start on penicillin followup with a dentist. I suspect patient may have de quevrian  tenosynovitis of his wrist. I have given him a Velcro thumb spica splint that he should wear 24 7. He will followup with primary care Dr. I do not think that any imaging is indicated emergently given his neck injury or bony tenderness.  Filed Vitals:   11/06/12 0123 11/06/12 0130 11/06/12 0200 11/06/12 0215  BP: 126/106 126/76 118/69 128/76  Pulse: 73 80 71 66  Temp: 97 F (36.1 C)     SpO2: 100% 100% 96% 100%        Hayes Czaja A Tetsuo Coppola, PA-C 11/06/12 0320

## 2012-11-07 NOTE — ED Provider Notes (Addendum)
Medical screening examination/treatment/procedure(s) were performed by non-physician practitioner and as supervising physician I was immediately available for consultation/collaboration.   Brandt Loosen, MD 12/03/12 [phone]

## 2013-01-08 ENCOUNTER — Emergency Department (HOSPITAL_COMMUNITY)
Admission: EM | Admit: 2013-01-08 | Discharge: 2013-01-08 | Disposition: A | Discharge status: Home / Self Care | Payer: Self-pay | Attending: Emergency Medicine | Admitting: Emergency Medicine

## 2013-01-08 ENCOUNTER — Encounter (HOSPITAL_COMMUNITY): Payer: Self-pay | Admitting: Emergency Medicine

## 2013-01-08 DIAGNOSIS — Z792 Long term (current) use of antibiotics: Secondary | ICD-10-CM | POA: Insufficient documentation

## 2013-01-08 DIAGNOSIS — K047 Periapical abscess without sinus: Secondary | ICD-10-CM | POA: Insufficient documentation

## 2013-01-08 DIAGNOSIS — Z791 Long term (current) use of non-steroidal anti-inflammatories (NSAID): Secondary | ICD-10-CM | POA: Insufficient documentation

## 2013-01-08 DIAGNOSIS — F17200 Nicotine dependence, unspecified, uncomplicated: Secondary | ICD-10-CM | POA: Insufficient documentation

## 2013-01-08 MED ORDER — IBUPROFEN 600 MG PO TABS: 600.0000 mg | ORAL_TABLET | Freq: Three times a day (TID) | ORAL | Status: DC

## 2013-01-08 MED ORDER — IBUPROFEN 200 MG PO TABS
600.0000 mg | ORAL_TABLET | Freq: Once | ORAL | Status: AC
  Administered 2013-01-08: 600 mg via ORAL
  Filled 2013-01-08: qty 3

## 2013-01-08 MED ORDER — PENICILLIN V POTASSIUM 250 MG PO TABS
500.0000 mg | ORAL_TABLET | Freq: Once | ORAL | Status: AC
  Administered 2013-01-08: 500 mg via ORAL
  Filled 2013-01-08: qty 2

## 2013-01-08 MED ORDER — PENICILLIN V POTASSIUM 500 MG PO TABS: 500.0000 mg | ORAL_TABLET | Freq: Four times a day (QID) | ORAL | Status: DC

## 2013-01-08 NOTE — ED Provider Notes (Signed)
CSN: [id number]     Arrival date & time 01/08/13  0025 History   First MD Initiated Contact with Patient 01/08/13 0108     Chief Complaint  Patient presents with  . Dental Pain   (Consider location/radiation/quality/duration/timing/severity/associated sxs/prior Treatment) HPI Comments: Patient with painful upper Right central incisor for the past 2 weeks has no DDS. Has not tired any medications PTA   Patient is a 52 y.o. male presenting with tooth pain. The history is provided by the patient.  Dental Pain Location:  Upper Upper teeth location:  8/RU central incisor Quality:  Aching Severity:  Mild Onset quality:  Gradual Duration:  2 weeks Timing:  Constant Progression:  Worsening Chronicity:  New Context: poor dentition   Ineffective treatments:  None tried Associated symptoms: facial pain and gum swelling   Associated symptoms: no facial swelling, no fever, no neck pain, no oral bleeding and no oral lesions     History reviewed. No pertinent past medical history. Past Surgical History  Procedure Laterality Date  . Mandible surgery     No family history on file. History  Substance Use Topics  . Smoking status: Current Every Day Smoker -- 0.25 packs/day    Types: Cigarettes  . Smokeless tobacco: Not on file  . Alcohol Use: Yes     Comment: occ.    Review of Systems  Constitutional: Negative for fever.  HENT: Positive for dental problem. Negative for facial swelling and mouth sores.   Musculoskeletal: Negative for neck pain.  Skin: Negative for wound.  Neurological: Negative for dizziness.  All other systems reviewed and are negative.    Allergies  Review of patient's allergies indicates no known allergies.  Home Medications   Current Outpatient Rx  Name  Route  Sig  Dispense  Refill  . acetaminophen (TYLENOL) 500 MG tablet   Oral   Take 1,000 mg by mouth 2 (two) times daily as needed for pain.         Marland Kitchen ibuprofen (ADVIL,MOTRIN) 200 MG tablet   Oral   Take 400 mg by mouth 2 (two) times daily as needed for pain. For pain         . ibuprofen (ADVIL,MOTRIN) 600 MG tablet   Oral   Take 1 tablet (600 mg total) by mouth 3 (three) times daily.   30 tablet   0   . penicillin v potassium (VEETID) 500 MG tablet   Oral   Take 1 tablet (500 mg total) by mouth 4 (four) times daily.   39 tablet   0    BP 123/70  Pulse 87  Temp(Src) 98.1 F (36.7 C) (Oral)  Resp 18  SpO2 98% Physical Exam  Vitals reviewed. Constitutional: He is oriented to person, place, and time. He appears well-developed and well-nourished.  HENT:  Head: Normocephalic.  Mouth/Throat: Uvula is midline.    Eyes: Pupils are equal, round, and reactive to light.  Neck: Normal range of motion.  Cardiovascular: Normal rate and regular rhythm.   Musculoskeletal: Normal range of motion.  Neurological: He is alert and oriented to person, place, and time.  Skin: Skin is warm. No erythema.    ED Course  Procedures (including critical care time) Labs Review Labs Reviewed - No data to display Imaging Review No results found.  EKG Interpretation   None       MDM   1. Dental abscess     Will Rx PCN and Ibuprofen and referr to DDS  Arman Filter, NP 01/08/13 [phone]

## 2013-01-08 NOTE — ED Provider Notes (Signed)
Medical screening examination/treatment/procedure(s) were performed by non-physician practitioner and as supervising physician I was immediately available for consultation/collaboration.     Jaryiah Mehlman, MD 01/08/13 0655 

## 2013-01-08 NOTE — ED Notes (Signed)
Dental pain for 2 weeks.

## 2013-01-08 NOTE — Discharge Instructions (Signed)
Abscess An abscess (boil or furuncle) is an infected area on or under the skin. This area is filled with yellowish-white fluid (pus) and other material (debris). HOME CARE   Only take medicines as told by your doctor.  If you were given antibiotic medicine, take it as directed. Finish the medicine even if you start to feel better.  If gauze is used, follow your doctor's directions for changing the gauze.  To avoid spreading the infection:  Keep your abscess covered with a bandage.  Wash your hands well.  Do not share personal care items, towels, or whirlpools with others.  Avoid skin contact with others.  Keep your skin and clothes clean around the abscess.  Keep all doctor visits as told. GET HELP RIGHT AWAY IF:   You have more pain, puffiness (swelling), or redness in the wound site.  You have more fluid or blood coming from the wound site.  You have muscle aches, chills, or you feel sick.  You have a fever. MAKE SURE YOU:   Understand these instructions.  Will watch your condition.  Will get help right away if you are not doing well or get worse. Document Released: 08/06/2007 Document Revised: 08/19/2011 Document Reviewed: 05/02/2011 [facility] Patient Information 2014 [place], [place]. Please take the antibiotics as directed until completed Call Dr. Mayford Knife first thing on Monday morning  Tell them you were referred through the Emergency Department

## 2013-03-01 ENCOUNTER — Emergency Department (HOSPITAL_COMMUNITY)
Admission: EM | Admit: 2013-03-01 | Discharge: 2013-03-01 | Disposition: A | Discharge status: Home / Self Care | Payer: Self-pay | Attending: Emergency Medicine | Admitting: Emergency Medicine

## 2013-03-01 ENCOUNTER — Encounter (HOSPITAL_COMMUNITY): Payer: Self-pay | Admitting: Emergency Medicine

## 2013-03-01 DIAGNOSIS — Z9889 Other specified postprocedural states: Secondary | ICD-10-CM | POA: Insufficient documentation

## 2013-03-01 DIAGNOSIS — F17200 Nicotine dependence, unspecified, uncomplicated: Secondary | ICD-10-CM | POA: Insufficient documentation

## 2013-03-01 DIAGNOSIS — K089 Disorder of teeth and supporting structures, unspecified: Secondary | ICD-10-CM | POA: Insufficient documentation

## 2013-03-01 DIAGNOSIS — K029 Dental caries, unspecified: Secondary | ICD-10-CM | POA: Insufficient documentation

## 2013-03-01 DIAGNOSIS — K0889 Other specified disorders of teeth and supporting structures: Secondary | ICD-10-CM

## 2013-03-01 MED ORDER — PENICILLIN V POTASSIUM 500 MG PO TABS: 500.0000 mg | ORAL_TABLET | Freq: Four times a day (QID) | ORAL | Status: AC

## 2013-03-01 MED ORDER — TRAMADOL HCL 50 MG PO TABS: 50.0000 mg | ORAL_TABLET | Freq: Four times a day (QID) | ORAL | Status: DC | PRN

## 2013-03-01 NOTE — ED Notes (Signed)
Pt complains of dental pain for one week 

## 2013-03-01 NOTE — ED Provider Notes (Signed)
CSN: [id number]     Arrival date & time 03/01/13  2054 History  This chart was scribed for non-physician practitioner, Ivonne Andrew, PA-C,working with Gerhard Munch, MD, by Karle Plumber, ED Scribe.  This patient was seen in room WTR5/WTR5 and the patient's care was started at 10:28 PM.  Chief Complaint  Patient presents with  . Dental Pain   The history is provided by the patient. No language interpreter was used.   HPI Comments:  David Bautista is a 52 y.o. male who presents to the Emergency Department complaining of worsening lower right dental pain for the past week. Pt describes the pain as a sharp, throbbing pain. He denies anything making the pain better or worse. He states he has been taking Motrin or Tylenol for pain with no relief. He reports having his jaw broken when he was younger but never had any problems with it. He denies any recent injury. He denies fevers, chills, and diaphoresis. Pt states he does not have a dentist.   History reviewed. No pertinent past medical history. Past Surgical History  Procedure Laterality Date  . Mandible surgery     History reviewed. No pertinent family history. History  Substance Use Topics  . Smoking status: Current Every Day Smoker -- 0.25 packs/day    Types: Cigarettes  . Smokeless tobacco: Not on file  . Alcohol Use: Yes     Comment: occ.    Review of Systems  Constitutional: Negative for fever, chills and diaphoresis.  HENT: Positive for dental problem.   All other systems reviewed and are negative.    Allergies  Review of patient's allergies indicates no known allergies.  Home Medications   Current Outpatient Rx  Name  Route  Sig  Dispense  Refill  . acetaminophen (TYLENOL) 500 MG tablet   Oral   Take 1,000 mg by mouth 2 (two) times daily as needed for pain.         Marland Kitchen ibuprofen (ADVIL,MOTRIN) 200 MG tablet   Oral   Take 400 mg by mouth 2 (two) times daily as needed for pain. For pain         . ibuprofen  (ADVIL,MOTRIN) 600 MG tablet   Oral   Take 1 tablet (600 mg total) by mouth 3 (three) times daily.   30 tablet   0   . penicillin v potassium (VEETID) 500 MG tablet   Oral   Take 1 tablet (500 mg total) by mouth 4 (four) times daily.   39 tablet   0    Triage Vitals: BP 100/77  Pulse 65  Temp(Src) 97.8 F (36.6 C) (Oral)  Resp 16  SpO2 99% Physical Exam  Nursing note and vitals reviewed. Constitutional: He is oriented to person, place, and time. He appears well-developed and well-nourished.  HENT:  Head: Normocephalic and atraumatic.  No pain or swelling under the tongue. No swelling of the gums. Lower left third molar chipped. Dental caries throughout. Missing teeth throughout. Pain with percussion of second and third right lower molars.  Eyes: EOM are normal.  Neck: Normal range of motion.  Cardiovascular: Normal rate.   Pulmonary/Chest: Effort normal.  Musculoskeletal: Normal range of motion.  Neurological: He is alert and oriented to person, place, and time.  Skin: Skin is warm and dry.  Psychiatric: He has a normal mood and affect. His behavior is normal.    ED Course  Procedures  DIAGNOSTIC STUDIES: Oxygen Saturation is 99% on RA, normal by my interpretation.  COORDINATION OF CARE: 10:33 PM- Will prescribe antibiotic and pain medication. Pt verbalizes understanding and agrees to plan.    MDM   1. Pain, dental      I personally performed the services described in this documentation, which was scribed in my presence. The recorded information has been reviewed and is accurate.    Angus Seller, PA-C 03/01/13 2308

## 2013-03-01 NOTE — ED Provider Notes (Signed)
  Medical screening examination/treatment/procedure(s) were performed by non-physician practitioner and as supervising physician I was immediately available for consultation/collaboration.  EKG Interpretation   None          Yerik Zeringue, MD 03/01/13 2327 

## 2013-08-28 ENCOUNTER — Encounter (HOSPITAL_COMMUNITY): Payer: Self-pay | Admitting: Emergency Medicine

## 2013-08-28 DIAGNOSIS — K089 Disorder of teeth and supporting structures, unspecified: Secondary | ICD-10-CM | POA: Insufficient documentation

## 2013-08-28 DIAGNOSIS — Z79899 Other long term (current) drug therapy: Secondary | ICD-10-CM | POA: Insufficient documentation

## 2013-08-28 DIAGNOSIS — Z9889 Other specified postprocedural states: Secondary | ICD-10-CM | POA: Insufficient documentation

## 2013-08-28 DIAGNOSIS — Z792 Long term (current) use of antibiotics: Secondary | ICD-10-CM | POA: Insufficient documentation

## 2013-08-28 DIAGNOSIS — H9209 Otalgia, unspecified ear: Secondary | ICD-10-CM | POA: Insufficient documentation

## 2013-08-28 DIAGNOSIS — F17200 Nicotine dependence, unspecified, uncomplicated: Secondary | ICD-10-CM | POA: Insufficient documentation

## 2013-08-28 DIAGNOSIS — K029 Dental caries, unspecified: Secondary | ICD-10-CM | POA: Insufficient documentation

## 2013-08-28 NOTE — ED Notes (Signed)
Pt. reports left lower molar pain onset yesterday unrelieved by OTC pain medications .

## 2013-08-29 ENCOUNTER — Emergency Department (HOSPITAL_COMMUNITY)
Admission: EM | Admit: 2013-08-29 | Discharge: 2013-08-29 | Disposition: A | Discharge status: Home / Self Care | Payer: Self-pay | Attending: Emergency Medicine | Admitting: Emergency Medicine

## 2013-08-29 DIAGNOSIS — K0889 Other specified disorders of teeth and supporting structures: Secondary | ICD-10-CM

## 2013-08-29 MED ORDER — HYDROCODONE-ACETAMINOPHEN 5-325 MG PO TABS: ORAL_TABLET | ORAL | Status: AC

## 2013-08-29 MED ORDER — PENICILLIN V POTASSIUM 500 MG PO TABS: 500.0000 mg | ORAL_TABLET | Freq: Three times a day (TID) | ORAL | Status: DC

## 2013-08-29 MED ORDER — IBUPROFEN 600 MG PO TABS: 600.0000 mg | ORAL_TABLET | Freq: Four times a day (QID) | ORAL | Status: AC | PRN

## 2013-08-29 MED ORDER — HYDROCODONE-ACETAMINOPHEN 5-325 MG PO TABS
1.0000 | ORAL_TABLET | Freq: Once | ORAL | Status: AC
  Administered 2013-08-29: 1 via ORAL
  Filled 2013-08-29: qty 1

## 2013-08-29 NOTE — ED Provider Notes (Signed)
CSN: [id number]     Arrival date & time 08/28/13  2256 History   First MD Initiated Contact with Patient 08/29/13 0046     Chief Complaint  Patient presents with  . Dental Pain     (Consider location/radiation/quality/duration/timing/severity/associated sxs/prior Treatment) HPI Comments: Patient presents with complaint of left lower mandibular molar pain for the past 2 days. He's been taking over-the-counter medication including ibuprofen and Tylenol without relief. He is noted mild facial swelling. Pain radiates to his left ear. No fever, neck pain or swelling, shortness of breath, trouble swallowing. The onset of this condition was acute. The course is constant. Aggravating factors: none. Alleviating factors: none.    Patient is a 53 y.o. male presenting with tooth pain. The history is provided by the patient.  Dental Pain Associated symptoms: no facial swelling, no fever, no headaches and no neck pain     History reviewed. No pertinent past medical history. Past Surgical History  Procedure Laterality Date  . Mandible surgery     No family history on file. History  Substance Use Topics  . Smoking status: Current Every Day Smoker -- 0.25 packs/day    Types: Cigarettes  . Smokeless tobacco: Not on file  . Alcohol Use: Yes     Comment: occ.    Review of Systems  Constitutional: Negative for fever.  HENT: Positive for dental problem and ear pain. Negative for facial swelling, sore throat and trouble swallowing.   Respiratory: Negative for shortness of breath and stridor.   Musculoskeletal: Negative for neck pain.  Skin: Negative for color change.  Neurological: Negative for headaches.      Allergies  Review of patient's allergies indicates no known allergies.  Home Medications   Prior to Admission medications   Medication Sig Start Date End Date Taking? Authorizing Provider  acetaminophen (TYLENOL) 500 MG tablet Take 1,000 mg by mouth 2 (two) times daily as needed for  pain.   Yes Historical Provider, MD  traMADol (ULTRAM) 50 MG tablet Take 50 mg by mouth every 6 (six) hours as needed for moderate pain.   Yes Historical Provider, MD  HYDROcodone-acetaminophen (NORCO/VICODIN) 5-325 MG per tablet Take 1-2 tablets every 6 hours as needed for severe pain 08/29/13   Renne CriglerJoshua Geiple, PA-C  ibuprofen (ADVIL,MOTRIN) 600 MG tablet Take 1 tablet (600 mg total) by mouth every 6 (six) hours as needed. 08/29/13   Renne CriglerJoshua Geiple, PA-C  penicillin v potassium (VEETID) 500 MG tablet Take 1 tablet (500 mg total) by mouth 3 (three) times daily. 08/29/13   Renne CriglerJoshua Geiple, PA-C   BP 117/73  Pulse 61  Temp(Src) 98.5 F (36.9 C) (Oral)  Ht 5\' 11"  (1.803 m)  Wt 215 lb (97.523 kg)  BMI 30.00 kg/m2  SpO2 99% Physical Exam  Nursing note and vitals reviewed. Constitutional: He appears well-developed and well-nourished.  HENT:  Head: Normocephalic and atraumatic.  Right Ear: Tympanic membrane, external ear and ear canal normal.  Left Ear: Tympanic membrane, external ear and ear canal normal.  Nose: Nose normal.  Mouth/Throat: Uvula is midline, oropharynx is clear and moist and mucous membranes are normal. No trismus in the jaw. Abnormal dentition. Dental caries present. No dental abscesses or uvula swelling. No tonsillar abscesses.  Patient with broken left mandibular third molar. This happens approximately one month ago due to a large cavity in the tooth. No surrounding erythema of gums. No significant gum tenderness. No palpable abscess or fluctuance.  Eyes: Pupils are equal, round, and reactive to light.  Neck: Normal range of motion. Neck supple.  No neck swelling or Lugwig's angina  Neurological: He is alert.  Skin: Skin is warm and dry.  Psychiatric: He has a normal mood and affect.    ED Course  Procedures (including critical care time) Labs Review Labs Reviewed - No data to display  Imaging Review No results found.   EKG Interpretation None      Patient seen and  examined. Medications ordered.   Vital signs reviewed and are as follows: Filed Vitals:   08/28/13 2332  BP: 117/73  Pulse: 61  Temp: 98.5 F (36.9 C)   Patient counseled to take prescribed medications as directed, return with worsening facial or neck swelling, and to follow-up with their dentist as soon as possible.   Patient counseled on use of narcotic pain medications. Counseled not to combine these medications with others containing tylenol. Urged not to drink alcohol, drive, or perform any other activities that requires focus while taking these medications. The patient verbalizes understanding and agrees with the plan.  Patient told to fill penicillin only if he develops facial swelling or neck swelling or is not improved in 72 hours. Low suspicion for dental infection.    MDM   Final diagnoses:  Pain, dental   Patient with toothache. No fever. Exam unconcerning for Ludwig's angina or other deep tissue infection in neck.   As there is no facial swelling or gum findings, will not prescribe antibiotics to take at this time. Patient provided with a prescription for penicillin to fill if symptoms worsen or do not improve in 72 hours.. Will treat with pain medication.       Renne CriglerJoshua Geiple, PA-C 08/29/13 [phone]

## 2013-08-29 NOTE — Discharge Instructions (Signed)
Please read and follow all provided instructions.  Your diagnoses today include:  1. Pain, dental     The exam and treatment you received today has been provided on an emergency basis only. This is not a substitute for complete medical or dental care.  Tests performed today include:  Vital signs. See below for your results today.   Medications prescribed:   Vicodin (hydrocodone/acetaminophen) - narcotic pain medication  DO NOT drive or perform any activities that require you to be awake and alert because this medicine can make you drowsy. BE VERY CAREFUL not to take multiple medicines containing Tylenol (also called acetaminophen). Doing so can lead to an overdose which can damage your liver and cause liver failure and possibly death.   Ibuprofen (Motrin, Advil) - anti-inflammatory pain medication  Do not exceed 600mg  ibuprofen every 6 hours, take with food  You have been prescribed an anti-inflammatory medication or NSAID. Take with food. Take smallest effective dose for the shortest duration needed for your pain. Stop taking if you experience stomach pain or vomiting.    Penicillin - antibiotic  You have been prescribed an antibiotic medicine: take the entire course of medicine even if you are feeling better. Stopping early can cause the antibiotic not to work.  Only fill this antibiotic if you worsen or continue to have symptoms in 72 hours. If filled, take entire course of antibiotics as directed and follow-up with your doctor.  Take any prescribed medications only as directed.  Home care instructions:  Follow any educational materials contained in this packet.  Follow-up instructions: Please follow-up with your dentist for further evaluation of your symptoms.   Dental Assistance: See below for dental referrals  Return instructions:   Please return to the Emergency Department if you experience worsening symptoms.  Please return if you develop a fever, you develop  more swelling in your face or neck, you have trouble breathing or swallowing food.  Please return if you have any other emergent concerns.  Additional Information:  Your vital signs today were: BP 117/73   Pulse 61   Temp(Src) 98.5 F (36.9 C) (Oral)   Ht 5\' 11"  (1.803 m)   Wt 215 lb (97.523 kg)   BMI 30.00 kg/m2   SpO2 99% If your blood pressure (BP) was elevated above 135/85 this visit, please have this repeated by your doctor within one month. -------------- Dental Care: Organization         Address  Phone  Notes  [facility] [address], [place] [phone] Accepts children up to age 52 who are enrolled in [place] or [place] Health Choice; pregnant women with a Medicaid card; and children who have applied for Medicaid or [place] Health Choice, but were declined, whose parents can pay a reduced fee at time of service.  [facility] [place]  [address], [place] [phone] Accepts children up to age 10421 who are enrolled in [place] or [place] Health Choice; pregnant women with a Medicaid card; and children who have applied for Medicaid or [place] Health Choice, but were declined, whose parents can pay a reduced fee at time of service.  [facility]  [address], [place] [phone] Patients are seen by appointment only. Walk-ins are not accepted. [facility] will see patients 52 years of age and older. Monday - Tuesday (8am-5pm) Most Wednesdays (8:30-5pm) $30 per visit, cash only  [facility]  [address], [place] [phone] Patients are seen by appointment only. Walk-ins are not accepted. [facility] will see patients 52 years of age and older. One Wednesday Evening (Monthly: Volunteer Based).  $30 per visit, cash only  [facility]  [phone] for adults; Children under age 114, call  Graduate Pediatric Dentistry at [phone]. Children aged 654-14, please call [phone] to request a pediatric application.  Dental services are provided in all areas of dental care including fillings, crowns and bridges, complete and partial dentures, implants, gum treatment, root canals, and extractions. Preventive care is also provided. Treatment is provided to both adults and children. Patients are selected via a lottery and there is often a waiting list.   [facility] [address], [place]  [phone] [email or web address]   [facility] [address], [place], [place] [phone], Ext. [phone] Second and Fourth Thursday of each month, opens at 6:30 AM; Clinic ends at 9 AM.  Patients are seen on a first-come first-served basis, and a limited number are seen during each clinic.   [facility]  [address], [place], [place] [phone]   Eligibility Requirements You must have lived in [place], [place], or [place] for at least the last three months.   You cannot be eligible for state or federal sponsored [place] insurance, including [employer], [place], or [employer]icare.   You generally cannot be eligible for healthcare insurance through your employer.    How to apply: Eligibility screenings are held every Tuesday and Wednesday afternoon from 1:00 pm until 4:00 pm. You do not need an appointment for the interview!  [facility] [address], [place], [place] [phone]   [facility]  [phone]   [facility]  [phone]   [facility]  [phone]

## 2013-08-29 NOTE — ED Provider Notes (Signed)
Medical screening examination/treatment/procedure(s) were performed by non-physician practitioner and as supervising physician I was immediately available for consultation/collaboration.   EKG Interpretation None        Joshua M Zavitz, MD 08/29/13 0746 

## 2015-05-17 ENCOUNTER — Emergency Department (HOSPITAL_COMMUNITY)
Admission: EM | Admit: 2015-05-17 | Discharge: 2015-05-17 | Disposition: A | Discharge status: Home / Self Care | Payer: Self-pay | Attending: Emergency Medicine | Admitting: Emergency Medicine

## 2015-05-17 ENCOUNTER — Encounter (HOSPITAL_COMMUNITY): Payer: Self-pay | Admitting: Emergency Medicine

## 2015-05-17 DIAGNOSIS — K047 Periapical abscess without sinus: Secondary | ICD-10-CM | POA: Insufficient documentation

## 2015-05-17 DIAGNOSIS — Z792 Long term (current) use of antibiotics: Secondary | ICD-10-CM | POA: Insufficient documentation

## 2015-05-17 DIAGNOSIS — K029 Dental caries, unspecified: Secondary | ICD-10-CM | POA: Insufficient documentation

## 2015-05-17 DIAGNOSIS — F17210 Nicotine dependence, cigarettes, uncomplicated: Secondary | ICD-10-CM | POA: Insufficient documentation

## 2015-05-17 DIAGNOSIS — K002 Abnormalities of size and form of teeth: Secondary | ICD-10-CM | POA: Insufficient documentation

## 2015-05-17 MED ORDER — KETOROLAC TROMETHAMINE 60 MG/2ML IM SOLN
60.0000 mg | Freq: Once | INTRAMUSCULAR | Status: AC
  Administered 2015-05-17: 60 mg via INTRAMUSCULAR
  Filled 2015-05-17: qty 2

## 2015-05-17 MED ORDER — HYDROCODONE-ACETAMINOPHEN 5-325 MG PO TABS
1.0000 | ORAL_TABLET | Freq: Once | ORAL | Status: AC
  Administered 2015-05-17: 1 via ORAL
  Filled 2015-05-17: qty 1

## 2015-05-17 MED ORDER — PENICILLIN V POTASSIUM 500 MG PO TABS: 500.0000 mg | ORAL_TABLET | Freq: Four times a day (QID) | ORAL | Status: AC

## 2015-05-17 MED ORDER — PENICILLIN V POTASSIUM 250 MG PO TABS
500.0000 mg | ORAL_TABLET | Freq: Once | ORAL | Status: AC
  Administered 2015-05-17: 500 mg via ORAL
  Filled 2015-05-17: qty 2

## 2015-05-17 NOTE — ED Notes (Signed)
Pt reports reports broken bottom R molar x 2 months that's becoming more painful. Denies fevers.

## 2015-05-17 NOTE — Discharge Instructions (Signed)
Dental Abscess Mr. Knute NeuCouch, take tylenol or ibuprofen as needed for your dental pain. Take antibiotics as well for your infection. See dentist within 3 days for close follow-up. If any symptoms worsen, come back to emergency department immediately. Thank you. A dental abscess is pus in or around a tooth. HOME CARE  Take medicines only as told by your dentist.  If you were prescribed antibiotic medicine, finish all of it even if you start to feel better.  Rinse your mouth (gargle) often with salt water.  Do not drive or use heavy machinery, like a lawn mower, while taking pain medicine.  Do not apply heat to the outside of your mouth.  Keep all follow-up visits as told by your dentist. This is important. GET HELP IF:  Your pain is worse, and medicine does not help. GET HELP RIGHT AWAY IF:  You have a fever or chills.  Your symptoms suddenly get worse.  You have a very bad headache.  You have problems breathing or swallowing.  You have trouble opening your mouth.  You have puffiness (swelling) in your neck or around your eye.   This information is not intended to replace advice given to you by your health care provider. Make sure you discuss any questions you have with your health care provider.   Document Released: 07/04/2014 Document Reviewed: 07/04/2014 Elsevier Interactive Patient Education [employer]2016 Elsevier Inc.

## 2015-05-17 NOTE — ED Provider Notes (Signed)
CSN: [id number]     Arrival date & time 05/17/15  0245 History  By signing my name below, I, David Bautista, attest that this documentation has been prepared under the direction and in the presence of [name], MD. Electronically Signed: Placido Bautista, ED Scribe. 05/17/2015. 3:29 AM.    Chief Complaint  Patient presents with  . Dental Pain   The history is provided by the patient. No language interpreter was used.    HPI Comments: David Bautista is a 55 y.o. male with a PMHx including dental pain who presents to the Emergency Department complaining of worsening, mild, right lower dental pain onset 2 weeks ago and worsening beginning earlier today. He reports a broken tooth in the affected region and associated, mild, swelling. His pain worsens with palpation or when eating. He denies any other associated symptoms at this time.    History reviewed. No pertinent past medical history. Past Surgical History  Procedure Laterality Date  . Mandible surgery     No family history on file. Social History  Substance Use Topics  . Smoking status: Current Every Day Smoker -- 0.25 packs/day    Types: Cigarettes  . Smokeless tobacco: None  . Alcohol Use: Yes     Comment: occ.    Review of Systems A complete 10 system review of systems was obtained and all systems are negative except as noted in the HPI and PMH.   Allergies  Review of patient's allergies indicates no known allergies.  Home Medications   Prior to Admission medications   Medication Sig Start Date End Date Taking? Authorizing Provider  acetaminophen (TYLENOL) 500 MG tablet Take 1,000 mg by mouth 2 (two) times daily as needed for pain.    Historical Provider, MD  HYDROcodone-acetaminophen (NORCO/VICODIN) 5-325 MG per tablet Take 1-2 tablets every 6 hours as needed for severe pain 08/29/13   Renne Crigler, PA-C  ibuprofen (ADVIL,MOTRIN) 600 MG tablet Take 1 tablet (600 mg total) by mouth every 6 (six) hours as needed.  08/29/13   Renne Crigler, PA-C  penicillin v potassium (VEETID) 500 MG tablet Take 1 tablet (500 mg total) by mouth 3 (three) times daily. 08/29/13   Renne Crigler, PA-C  traMADol (ULTRAM) 50 MG tablet Take 50 mg by mouth every 6 (six) hours as needed for moderate pain.    Historical Provider, MD   Pulse 75  Temp(Src) 98.1 F (36.7 C) (Oral)  Resp 18  Ht  (1.803 m)  Wt 222 lb 14.4 oz (101.107 kg)  BMI 31.10 kg/m2  SpO2 99%    Physical Exam  Constitutional: He is oriented to person, place, and time. Vital signs are normal. He appears well-developed and well-nourished.  Non-toxic appearance. He does not appear ill. No distress.  HENT:  Head: Normocephalic and atraumatic.  Nose: Nose normal.  Mouth/Throat: Oropharynx is clear and moist. No oropharyngeal exudate.  Poor dentition, particularly at tooth #31 with significant decay and mild swelling around the tooth.  Eyes: Conjunctivae and EOM are normal. Pupils are equal, round, and reactive to light. No scleral icterus.  Neck: Normal range of motion. Neck supple. No tracheal deviation, no edema, no erythema and normal range of motion present. No thyroid mass and no thyromegaly present.  Cardiovascular: Normal rate, regular rhythm, S1 normal, S2 normal, normal heart sounds, intact distal pulses and normal pulses.  Exam reveals no gallop and no friction rub.   No murmur heard. Pulmonary/Chest: Effort normal and breath sounds normal. No respiratory distress.  He has no wheezes. He has no rhonchi. He has no rales.  Abdominal: Soft. Normal appearance and bowel sounds are normal. He exhibits no distension, no ascites and no mass. There is no hepatosplenomegaly. There is no tenderness. There is no rebound, no guarding and no CVA tenderness.  Musculoskeletal: Normal range of motion. He exhibits no edema or tenderness.  Lymphadenopathy:    He has no cervical adenopathy.  Neurological: He is alert and oriented to person, place, and time. He has  normal strength. No cranial nerve deficit or sensory deficit.  Skin: Skin is warm, dry and intact. No petechiae and no rash noted. He is not diaphoretic. No erythema. No pallor.  Psychiatric: He has a normal mood and affect. His behavior is normal. Judgment normal.  Nursing note and vitals reviewed.   ED Course  Procedures  DIAGNOSTIC STUDIES: Oxygen Saturation is 99% on RA, normal by my interpretation.    COORDINATION OF CARE: 3:22 AM Discussed next steps with pt and he agreed with the plan.   Labs Review Labs Reviewed - No data to display  Imaging Review No results found.   EKG Interpretation None      MDM   Final diagnoses:  None    Patient presents emergency department for dental pain. He was given Toradol injection and Norco tablet for pain control. Advised to continue Tylenol or ibuprofen at home. Dental follow-up was provided for him. Also placed on antibiotics for swelling around the tooth. He appears well and in no acute distress, vital signs remain within his normal limits and he is safe for discharge.   I personally performed the services described in this documentation, which was scribed in my presence. The recorded information has been reviewed and is accurate.     Tomasita CrumbleAdeleke Daryana Whirley, MD 05/17/15 [phone]

## 2015-05-17 NOTE — ED Notes (Signed)
EDP at bedside
